# Patient Record
Sex: Female | Born: 2000 | Race: Black or African American | Hispanic: No | Marital: Single | Smoking: Never smoker
Health system: Southern US, Community
[De-identification: ages and names within clinical notes are randomized; demographics above are authoritative.]

## PROBLEM LIST (undated history)

## (undated) DIAGNOSIS — F419 Anxiety disorder, unspecified: Secondary | ICD-10-CM

## (undated) DIAGNOSIS — F4310 Post-traumatic stress disorder, unspecified: Secondary | ICD-10-CM

---

## 2020-10-16 ENCOUNTER — Emergency Department (HOSPITAL_COMMUNITY): Payer: BLUE CROSS/BLUE SHIELD

## 2020-10-16 ENCOUNTER — Emergency Department (HOSPITAL_COMMUNITY)
Admission: EM | Admit: 2020-10-16 | Discharge: 2020-10-17 | Disposition: A | Discharge status: Home / Self Care | Payer: BLUE CROSS/BLUE SHIELD | Attending: Emergency Medicine | Admitting: Emergency Medicine

## 2020-10-16 ENCOUNTER — Other Ambulatory Visit: Payer: Self-pay

## 2020-10-16 ENCOUNTER — Encounter (HOSPITAL_COMMUNITY): Payer: Self-pay | Admitting: Emergency Medicine

## 2020-10-16 DIAGNOSIS — M542 Cervicalgia: Secondary | ICD-10-CM | POA: Insufficient documentation

## 2020-10-16 DIAGNOSIS — R200 Anesthesia of skin: Secondary | ICD-10-CM | POA: Diagnosis not present

## 2020-10-16 DIAGNOSIS — R791 Abnormal coagulation profile: Secondary | ICD-10-CM | POA: Diagnosis not present

## 2020-10-16 DIAGNOSIS — R531 Weakness: Secondary | ICD-10-CM | POA: Diagnosis not present

## 2020-10-16 DIAGNOSIS — R202 Paresthesia of skin: Secondary | ICD-10-CM | POA: Diagnosis not present

## 2020-10-16 HISTORY — DX: Anxiety disorder, unspecified: F41.9

## 2020-10-16 HISTORY — DX: Post-traumatic stress disorder, unspecified: F43.10

## 2020-10-16 LAB — COMPREHENSIVE METABOLIC PANEL
ALT: 17 U/L (ref 0–44)
AST: 23 U/L (ref 15–41)
Albumin: 4.4 g/dL (ref 3.5–5.0)
Alkaline Phosphatase: 53 U/L (ref 38–126)
Anion gap: 8 (ref 5–15)
BUN: <5 mg/dL — ABNORMAL LOW (ref 6–20)
CO2: 24 mmol/L (ref 22–32)
Calcium: 9.8 mg/dL (ref 8.9–10.3)
Chloride: 104 mmol/L (ref 98–111)
Creatinine, Ser: 0.67 mg/dL (ref 0.44–1.00)
GFR, Estimated: >60 mL/min (ref >60)
Glucose, Bld: 83 mg/dL (ref 70–99)
Potassium: 3.5 mmol/L (ref 3.5–5.1)
Sodium: 136 mmol/L (ref 135–145)
Total Bilirubin: 0.8 mg/dL (ref 0.3–1.2)
Total Protein: 8.1 g/dL (ref 6.5–8.1)

## 2020-10-16 LAB — ETHANOL: Alcohol, Ethyl (B): <10 mg/dL (ref <10)

## 2020-10-16 LAB — URINALYSIS, ROUTINE W REFLEX MICROSCOPIC
Bilirubin Urine: NEGATIVE
Glucose, UA: NEGATIVE mg/dL
Ketones, ur: NEGATIVE mg/dL
Leukocytes,Ua: NEGATIVE
Nitrite: NEGATIVE
Protein, ur: NEGATIVE mg/dL
Specific Gravity, Urine: 1 — ABNORMAL LOW (ref 1.005–1.030)
pH: 7 (ref 5.0–8.0)

## 2020-10-16 LAB — CBC
HCT: 38.8 % (ref 36.0–46.0)
Hemoglobin: 13.2 g/dL (ref 12.0–15.0)
MCH: 26.8 pg (ref 26.0–34.0)
MCHC: 34 g/dL (ref 30.0–36.0)
MCV: 78.7 fL — ABNORMAL LOW (ref 80.0–100.0)
Platelets: 261 10*3/uL (ref 150–400)
RBC: 4.93 MIL/uL (ref 3.87–5.11)
RDW: 12.6 % (ref 11.5–15.5)
WBC: 8.8 10*3/uL (ref 4.0–10.5)
nRBC: 0 % (ref 0.0–0.2)

## 2020-10-16 LAB — RAPID URINE DRUG SCREEN, HOSP PERFORMED
Amphetamines: NOT DETECTED
Barbiturates: NOT DETECTED
Benzodiazepines: NOT DETECTED
Cocaine: NOT DETECTED
Opiates: NOT DETECTED
Tetrahydrocannabinol: NOT DETECTED

## 2020-10-16 LAB — DIFFERENTIAL
Abs Immature Granulocytes: 0.03 10*3/uL (ref 0.00–0.07)
Basophils Absolute: 0 10*3/uL (ref 0.0–0.1)
Basophils Relative: 1 %
Eosinophils Absolute: 0.2 10*3/uL (ref 0.0–0.5)
Eosinophils Relative: 2 %
Immature Granulocytes: 0 %
Lymphocytes Relative: 28 %
Lymphs Abs: 2.5 10*3/uL (ref 0.7–4.0)
Monocytes Absolute: 0.7 10*3/uL (ref 0.1–1.0)
Monocytes Relative: 8 %
Neutro Abs: 5.4 10*3/uL (ref 1.7–7.7)
Neutrophils Relative %: 61 %

## 2020-10-16 LAB — PROTIME-INR
INR: 1.1 (ref 0.8–1.2)
Prothrombin Time: 14.2 seconds (ref 11.4–15.2)

## 2020-10-16 LAB — APTT: aPTT: 28 seconds (ref 24–36)

## 2020-10-16 LAB — I-STAT BETA HCG BLOOD, ED (MC, WL, AP ONLY): I-stat hCG, quantitative: <5 m[IU]/mL (ref <5)

## 2020-10-16 NOTE — ED Provider Notes (Signed)
Emergency Medicine Provider Triage Evaluation Note  Emergency Medicine Provider Triage Evaluation Note  Kristine Maldonado , a 20 y.o. female  was evaluated in triage. Pt complains of left sided weakness, from [facility], transported by EMS.  Last felt normal at 3pm.  She was doing laundry when she began to feel weakness in her left arm.  She also felt some tingling in her left face.  No vision change or loss.  She was able to walk, talk normally.  No associated headache.  No facial droop or slurred speech.  No history of sickle cell or other chronic medical complaints.  She denies drugs or alcohol.  She feels that her symptoms have not changed.  No history of similar symptoms.  Denies other medical complaints.  No history of migraines.   Review of Systems  Positive: Weakness Negative: Headache  Physical Exam  BP 127/72   Pulse 74   Temp 98.9 F (37.2 C) (Oral)   LMP 10/14/2020 (Exact Date)   SpO2 100%  Gen:   Awake, no distress   Resp:  Normal effort  MSK:   Moves extremities without difficulty  Other:  No upper or lower extremity strength deficits on my exam.  No facial droop, slurred speech or gross cranial nerve abnormalities.  Negative pronator drift.  Medical Decision Making  Medically screening exam initiated at 8:08 PM.  Appropriate orders placed.  Cherity Facemire was informed that the remainder of the evaluation will be completed by another provider, this initial triage assessment does not replace that evaluation, and the importance of remaining in the ED until their evaluation is complete.  No code stroke as symptoms have been ongoing for more than 5 hours and patient is Zenaida Niece negative.   Renne Crigler, PA-C 10/16/20 2010    Milagros Loll, MD 10/16/20 2213

## 2020-10-16 NOTE — ED Triage Notes (Signed)
Patient here with left sided weakness since 3pm today.  Patient is from [facility].  No slurred speech, no facial droop, no abnormal gait.  Patient has stated she has had trouble forming words.  Patient does have a history of anxiety.  The weakness is mostly in left arm.

## 2020-10-16 NOTE — ED Notes (Signed)
Monesha Monreal (mother), req update on pt. [phone]

## 2020-10-17 ENCOUNTER — Emergency Department (HOSPITAL_COMMUNITY): Payer: BLUE CROSS/BLUE SHIELD

## 2020-10-17 IMAGING — MR MR CERVICAL SPINE WO/W CM
4 of 8 series · 17 of 48 positions shown · IV contrast (gadavist)
Comparison: Head CT [DATE].

CLINICAL DATA: Neuro deficit, acute, stroke suspected. Additional
history provided: Patient reports increased numbness to left arm,
numbness radiating from neck to arm.

EXAM:
MRI HEAD WITHOUT AND WITH CONTRAST
MRI CERVICAL SPINE WITHOUT AND WITH CONTRAST
TECHNIQUE: Multiplanar, multiecho pulse sequences of the brain and surrounding
structures, and cervical spine, to include the craniocervical
junction and cervicothoracic junction, were obtained without and
with intravenous contrast.
CONTRAST:  7 mL Gadavist intravenous contrast.

[Series 12: T2 · sagittal · 3.0mm · 0.43mm/px · 4 of 12 slices shown (1 of 2)]
[im 1/12]
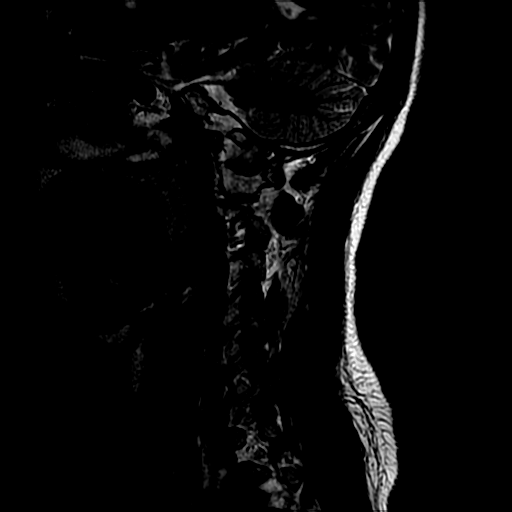
[im 4/12]
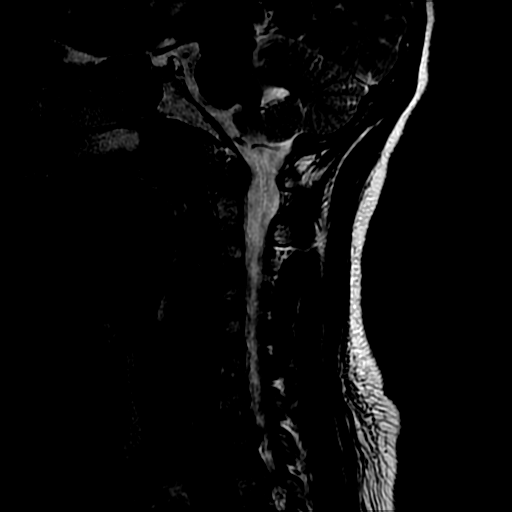
[im 8/12]
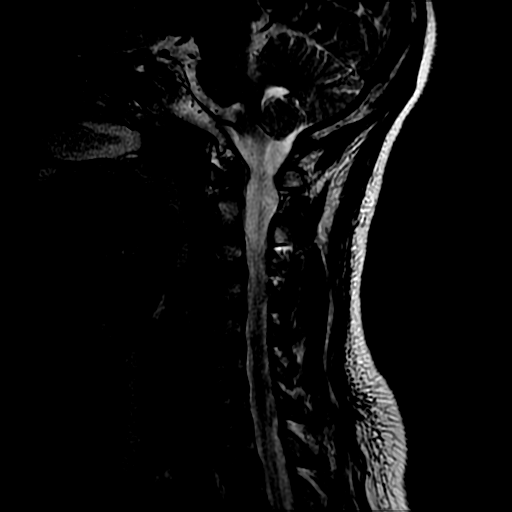
[im 12/12]
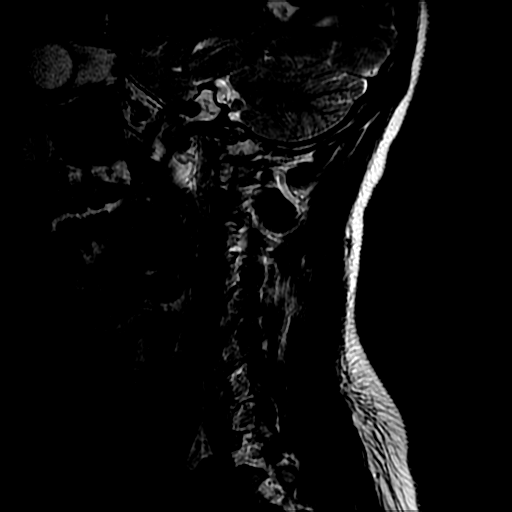

[Series 14: T1 · sagittal · 3.0mm · 0.43mm/px · 3 of 12 slices shown (1 of 2)]
[im 1/12]
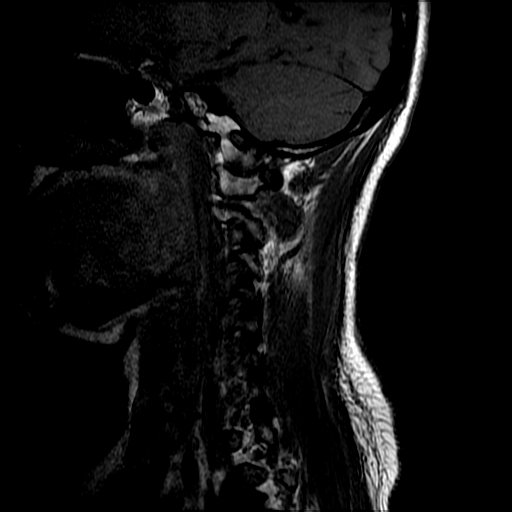
[im 8/12]
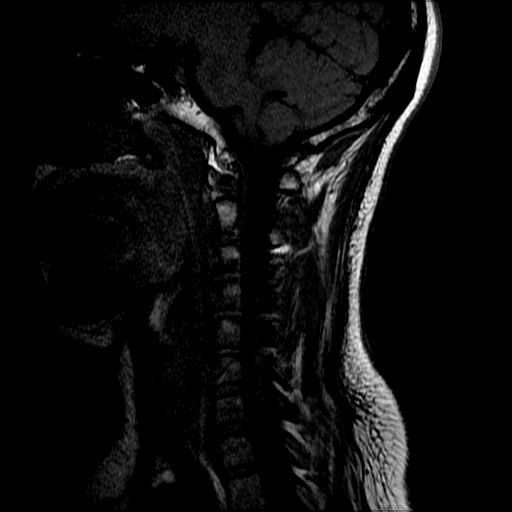
[im 12/12]
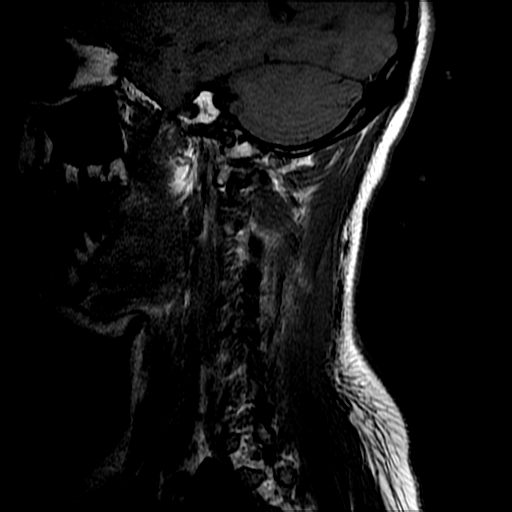

[Series 16: T2 · axial · 3.0mm · 0.39mm/px · z∈[-166,-91]mm · 7 of 28 slices shown (2 of 2)]
[im 1/28]
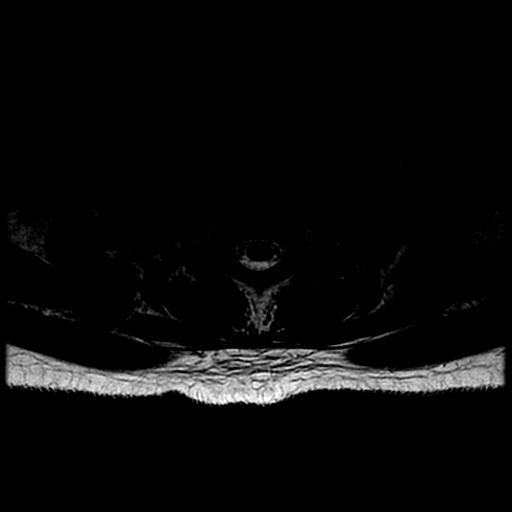
[im 4/28]
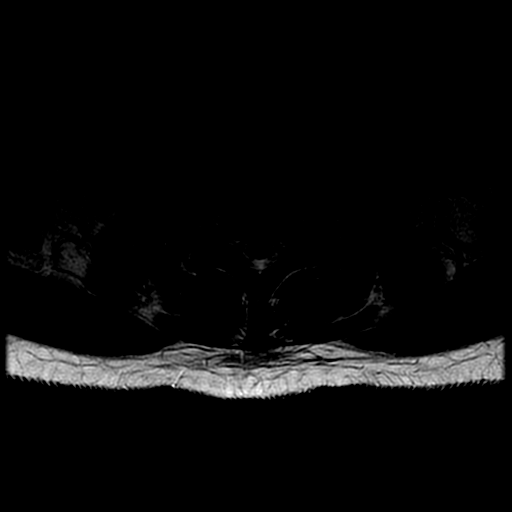
[im 8/28]
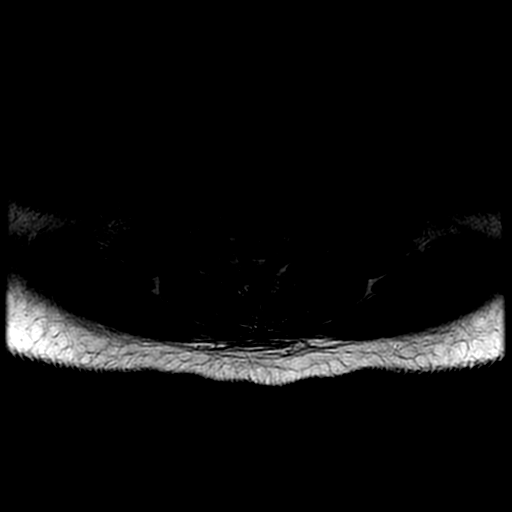
[im 12/28]
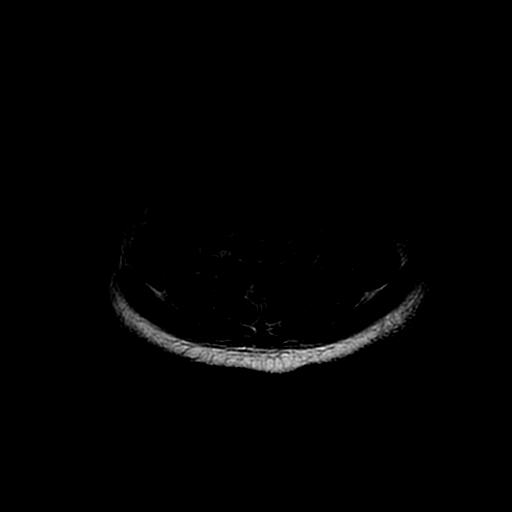
[im 16/28]
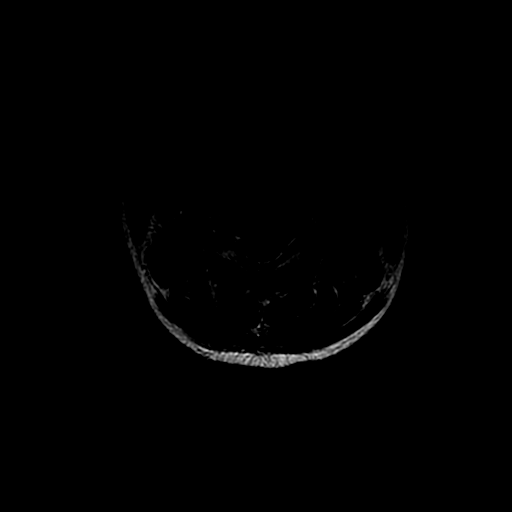
[im 20/28]
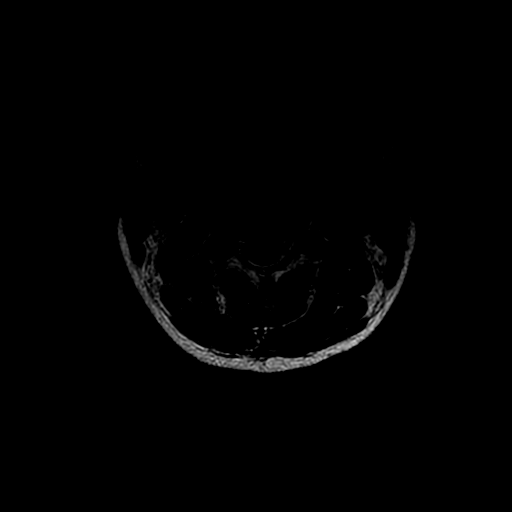
[im 24/28]
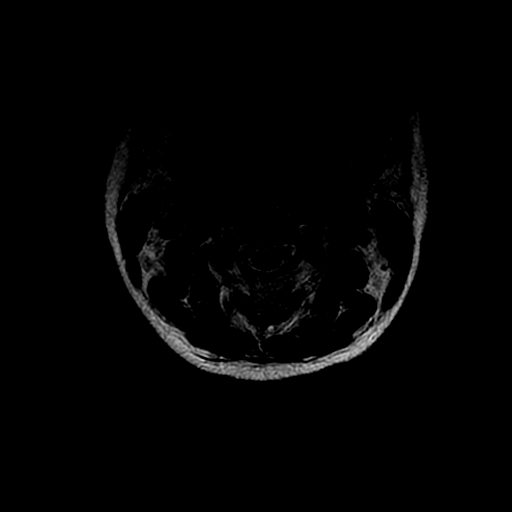

[Series 17: T1 · axial · non-contrast · 3.0mm · 0.39mm/px · z∈[-156,-91]mm · 3 of 28 slices shown (2 of 2)]
[im 4/28]
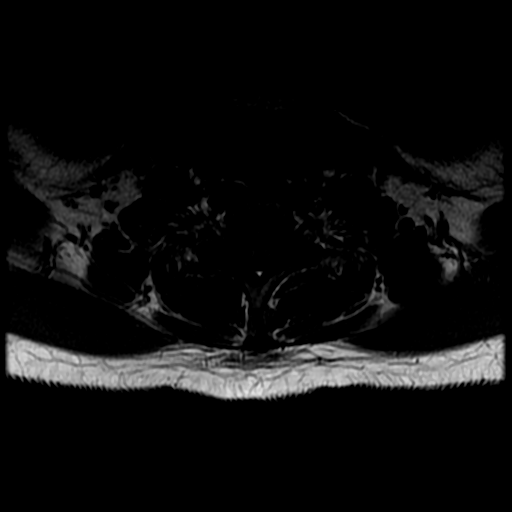
[im 16/28]
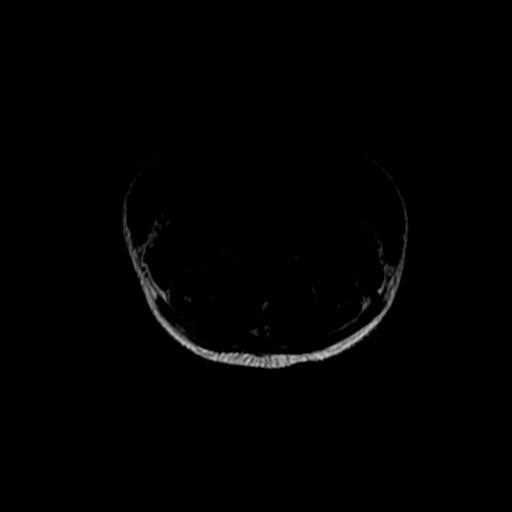
[im 24/28]
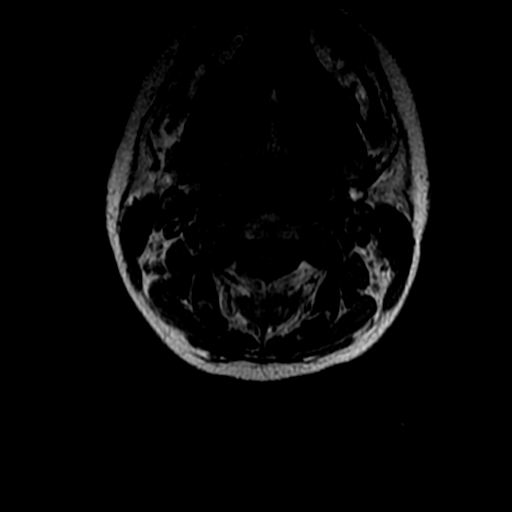

[17 of 48 positions shown; findings below may reference images not displayed]

FINDINGS: MRI HEAD FINDINGS

Brain:

Cerebral volume is normal.

No cortical encephalomalacia is identified. No significant cerebral
white matter disease.

There is no acute infarct.

No evidence of an intracranial mass.

No chronic intracranial blood products.

No extra-axial fluid collection.

No midline shift.

No pathologic intracranial enhancement identified.

Vascular: Maintained flow voids within the proximal large arterial
vessels.

Skull and upper cervical spine: No focal suspicious marrow lesion.

Sinuses/Orbits: Visualized orbits show no acute finding. No
significant paranasal sinus disease.

Other: Trace fluid within the right mastoid air cells.

MRI CERVICAL SPINE FINDINGS

Intermittently motion degraded exam, limiting evaluation. Most
notably, there is mild-to-moderate motion degradation of the axial
T2 TSE sequence and of the axial T1 weighted precontrast sequence.

Alignment: Straightening of the expected cervical lordosis. No
significant spondylolisthesis.

Vertebrae: Vertebral body height is maintained. No significant
marrow edema or focal suspicious osseous lesion.

Cord: Within the limitations of motion degradation, no spinal cord
signal abnormality is identified. No appreciable abnormal spinal
cord enhancement.

Posterior Fossa, vertebral arteries, paraspinal tissues: Posterior
fossa better assessed on concurrently performed brain MRI. Flow
voids preserved within the imaged cervical vertebral arteries.
Paraspinal soft tissues unremarkable.

Disc levels:

Intervertebral disc height and hydration are preserved.

Borderline congenitally narrow cervical spinal canal.

C2-C3: No significant disc herniation or stenosis.

C3-C4: Shallow disc bulge. Uncovertebral hypertrophy on the right.
No significant spinal canal stenosis. Mild right neural foraminal
narrowing.

C4-C5: Small central disc protrusion. Uncovertebral hypertrophy on
the right. The disc protrusion results in minimal focal effacement
of the ventral thecal sac (without spinal cord mass effect or
significant central canal stenosis). Minimal relative right neural
foraminal narrowing.

C5-C6: No significant disc herniation or stenosis.

C6-C7: No significant disc herniation or stenosis.

C7-T1: No significant disc herniation or stenosis.
IMPRESSION: MRI brain:

1. Unremarkable MRI appearance of the brain. No evidence of acute
intracranial abnormality.
2. Trace fluid within the right mastoid air cells.

MRI cervical spine:

1. Motion degraded exam.
2. Mild cervical spondylosis, as described and most notably as
follows.
3. At C4-C5, there is a small central disc protrusion. Uncovertebral
hypertrophy on the right. The disc protrusion results in minimal
focal effacement of the ventral thecal sac (without spinal cord mass
effect or significant central canal stenosis). Minimal relative
right neural foraminal narrowing.
4. At C3-C4, there is a shallow disc bulge without spinal canal
stenosis. Uncovertebral hypertrophy on the right resulting in
minimal relative right neural foraminal narrowing.
5. Within the limitations of motion degradation, no signal
abnormality or abnormal enhancement is identified within the
cervical spinal cord.
6. Straightening of the expected cervical lordosis.

## 2020-10-17 IMAGING — MR MR HEAD WO/W CM
8 of 16 series · 26 of 48 positions shown · IV contrast (gadavist)
Comparison: Head CT [DATE].

CLINICAL DATA: Neuro deficit, acute, stroke suspected. Additional
history provided: Patient reports increased numbness to left arm,
numbness radiating from neck to arm.

EXAM:
MRI HEAD WITHOUT AND WITH CONTRAST
MRI CERVICAL SPINE WITHOUT AND WITH CONTRAST
TECHNIQUE: Multiplanar, multiecho pulse sequences of the brain and surrounding
structures, and cervical spine, to include the craniocervical
junction and cervicothoracic junction, were obtained without and
with intravenous contrast.
CONTRAST:  7 mL Gadavist intravenous contrast.

[Series 3: DWI · axial · 3.0mm · 1.09mm/px · z∈[-64,+60]mm · 6 of 90 slices shown (1 of 4)]
[im 1/90]
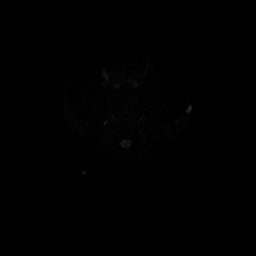
[im 18/90]
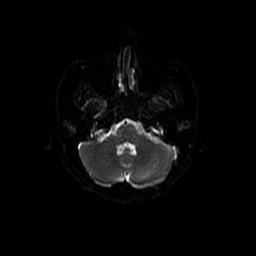
[im 36/90]
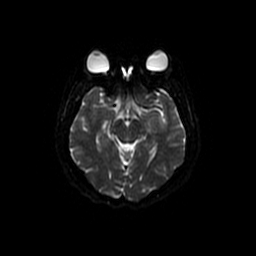
[im 54/90]
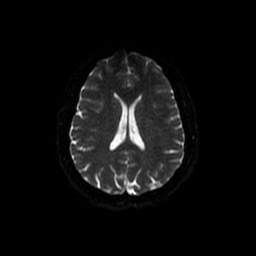
[im 72/90]
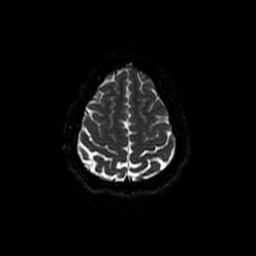
[im 90/90]
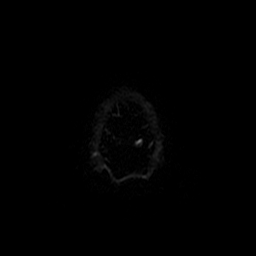

[Series 5: DWI · coronal · 5.0mm · 1.09mm/px · 3 of 66 slices shown (2 of 4)]
[im 1/66]
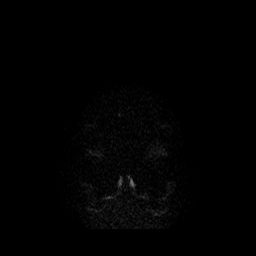
[im 33/66]
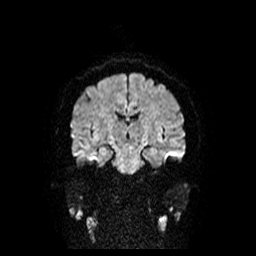
[im 66/66]
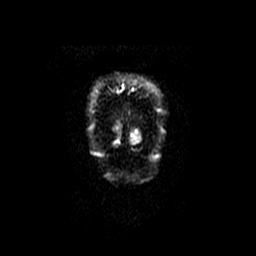

[Series 7: FLAIR · axial · 3.0mm · 0.43mm/px · 1 of 24 slices shown (1 of 2)]
[im 1/24]
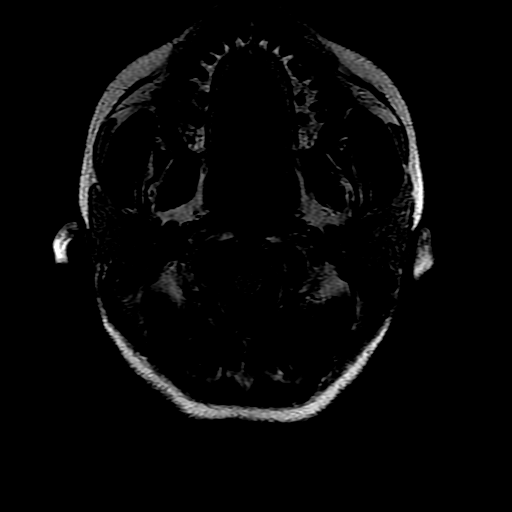

[Series 11: FLAIR · sagittal · 1.2mm · 0.49mm/px · 8 of 256 slices shown (2 of 2)]
[im 1/256]
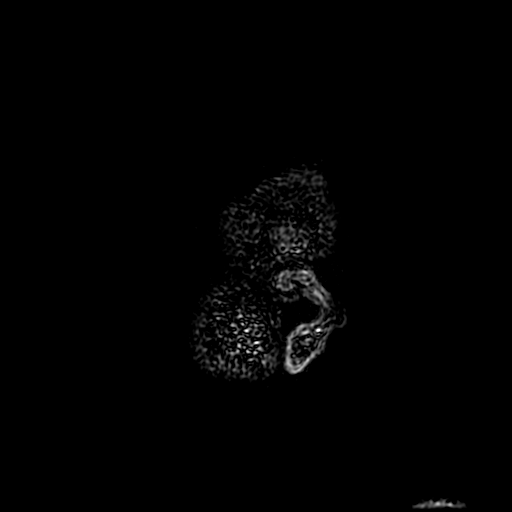
[im 40/256]
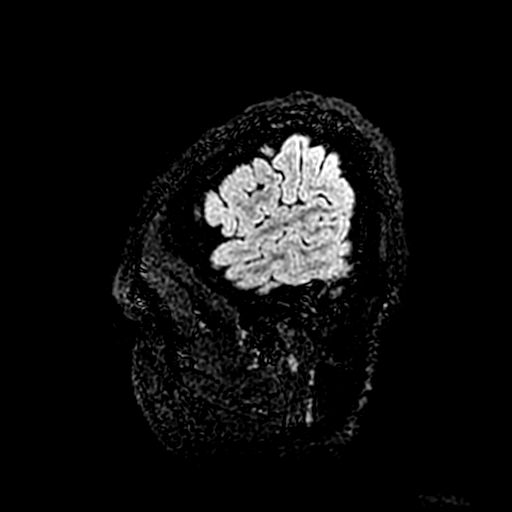
[im 79/256]
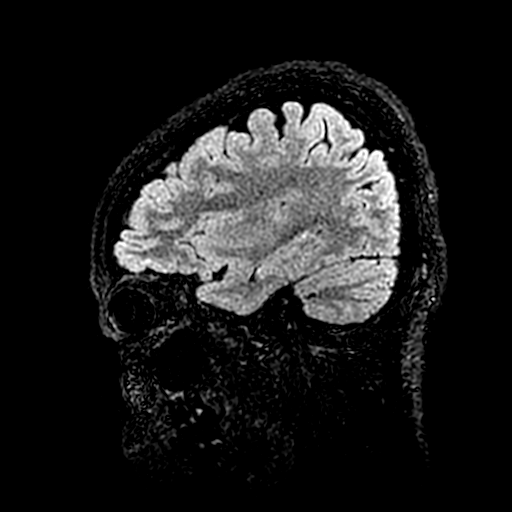
[im 118/256]
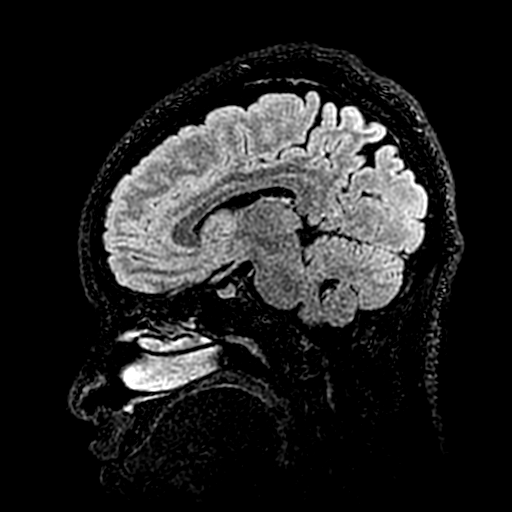
[im 138/256]
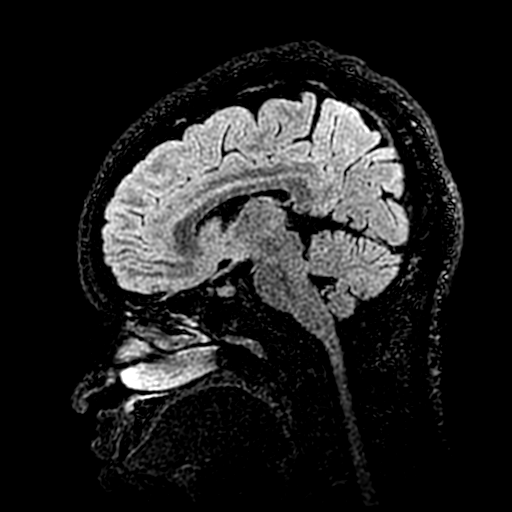
[im 177/256]
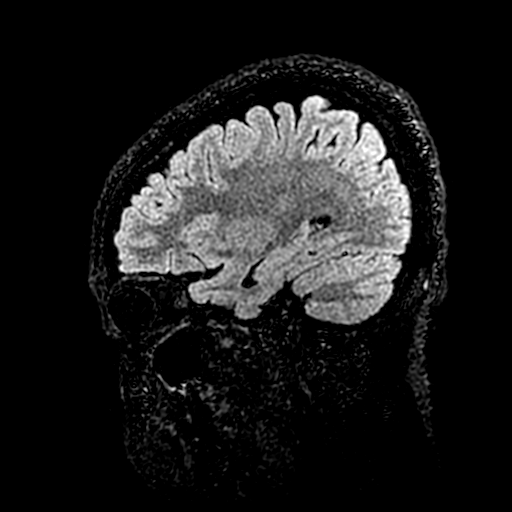
[im 216/256]
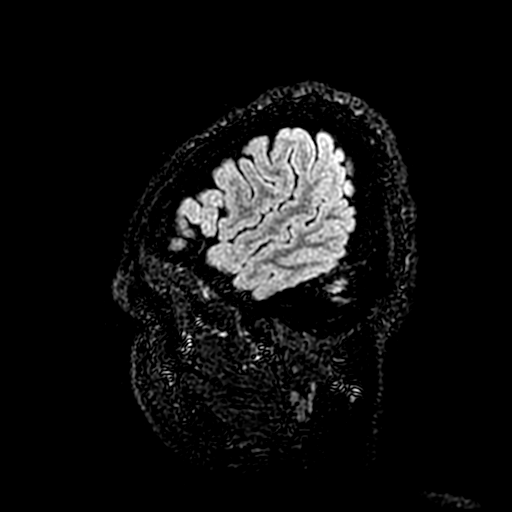
[im 256/256]
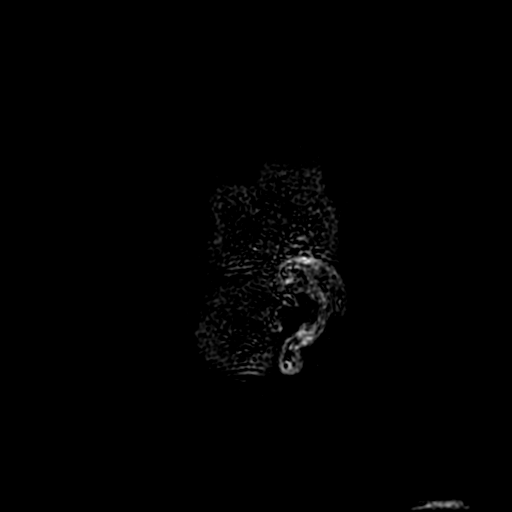

[Series 21: T1 post-contrast · axial · 3.0mm · 0.43mm/px · z∈[-69,+65]mm · 3 of 50 slices shown (1 of 2)]
[im 1/50]
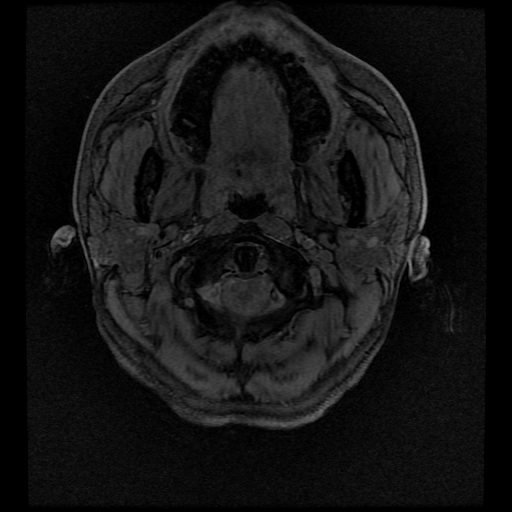
[im 25/50]
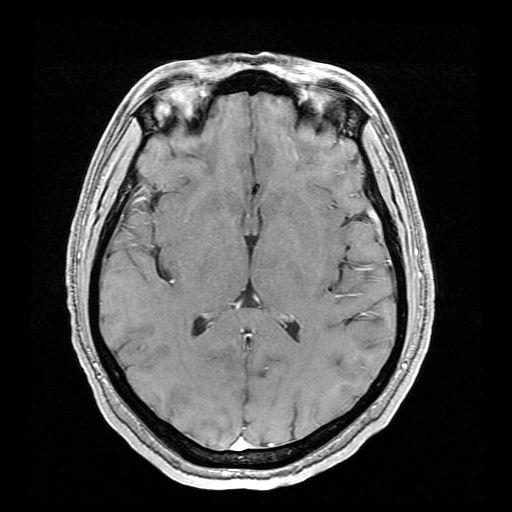
[im 50/50]
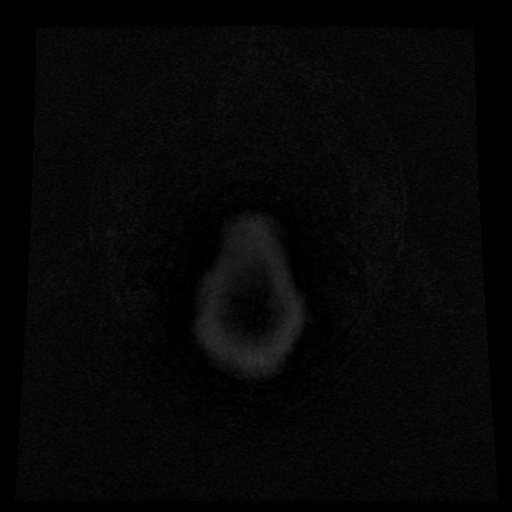

[Series 22: T1 post-contrast · coronal · 5.0mm · 0.39mm/px · 1 of 24 slices shown (2 of 2)]
[im 1/24]
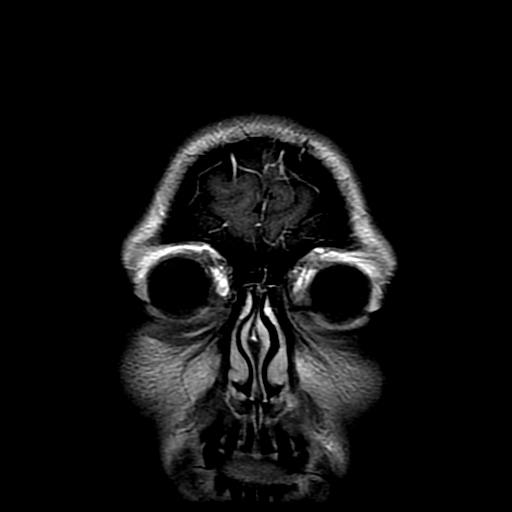

[Series 300: DWI · axial · 3.0mm · 1.09mm/px · z∈[-64,+60]mm · 2 of 45 slices shown (3 of 4)]
[im 1/45]
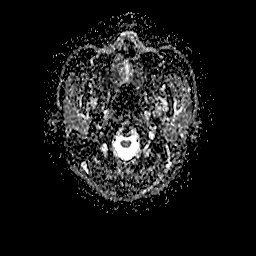
[im 45/45]
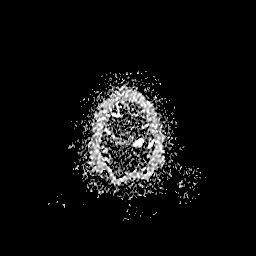

[Series 500: DWI · coronal · 5.0mm · 1.09mm/px · 2 of 33 slices shown (4 of 4)]
[im 1/33]
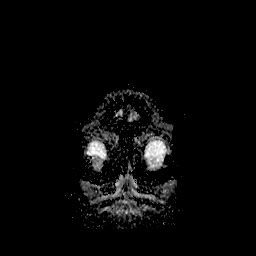
[im 33/33]
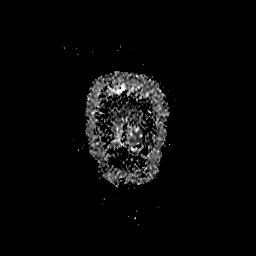

[26 of 48 positions shown; findings below may reference images not displayed]

FINDINGS: MRI HEAD FINDINGS

Brain:

Cerebral volume is normal.

No cortical encephalomalacia is identified. No significant cerebral
white matter disease.

There is no acute infarct.

No evidence of an intracranial mass.

No chronic intracranial blood products.

No extra-axial fluid collection.

No midline shift.

No pathologic intracranial enhancement identified.

Vascular: Maintained flow voids within the proximal large arterial
vessels.

Skull and upper cervical spine: No focal suspicious marrow lesion.

Sinuses/Orbits: Visualized orbits show no acute finding. No
significant paranasal sinus disease.

Other: Trace fluid within the right mastoid air cells.

MRI CERVICAL SPINE FINDINGS

Intermittently motion degraded exam, limiting evaluation. Most
notably, there is mild-to-moderate motion degradation of the axial
T2 TSE sequence and of the axial T1 weighted precontrast sequence.

Alignment: Straightening of the expected cervical lordosis. No
significant spondylolisthesis.

Vertebrae: Vertebral body height is maintained. No significant
marrow edema or focal suspicious osseous lesion.

Cord: Within the limitations of motion degradation, no spinal cord
signal abnormality is identified. No appreciable abnormal spinal
cord enhancement.

Posterior Fossa, vertebral arteries, paraspinal tissues: Posterior
fossa better assessed on concurrently performed brain MRI. Flow
voids preserved within the imaged cervical vertebral arteries.
Paraspinal soft tissues unremarkable.

Disc levels:

Intervertebral disc height and hydration are preserved.

Borderline congenitally narrow cervical spinal canal.

C2-C3: No significant disc herniation or stenosis.

C3-C4: Shallow disc bulge. Uncovertebral hypertrophy on the right.
No significant spinal canal stenosis. Mild right neural foraminal
narrowing.

C4-C5: Small central disc protrusion. Uncovertebral hypertrophy on
the right. The disc protrusion results in minimal focal effacement
of the ventral thecal sac (without spinal cord mass effect or
significant central canal stenosis). Minimal relative right neural
foraminal narrowing.

C5-C6: No significant disc herniation or stenosis.

C6-C7: No significant disc herniation or stenosis.

C7-T1: No significant disc herniation or stenosis.
IMPRESSION: MRI brain:

1. Unremarkable MRI appearance of the brain. No evidence of acute
intracranial abnormality.
2. Trace fluid within the right mastoid air cells.

MRI cervical spine:

1. Motion degraded exam.
2. Mild cervical spondylosis, as described and most notably as
follows.
3. At C4-C5, there is a small central disc protrusion. Uncovertebral
hypertrophy on the right. The disc protrusion results in minimal
focal effacement of the ventral thecal sac (without spinal cord mass
effect or significant central canal stenosis). Minimal relative
right neural foraminal narrowing.
4. At C3-C4, there is a shallow disc bulge without spinal canal
stenosis. Uncovertebral hypertrophy on the right resulting in
minimal relative right neural foraminal narrowing.
5. Within the limitations of motion degradation, no signal
abnormality or abnormal enhancement is identified within the
cervical spinal cord.
6. Straightening of the expected cervical lordosis.

## 2020-10-17 MED ORDER — METHYLPREDNISOLONE 4 MG PO TBPK: ORAL_TABLET | ORAL | 0 refills | Status: AC

## 2020-10-17 MED ORDER — GADOBUTROL 1 MMOL/ML IV SOLN
7.0000 mL | Freq: Once | INTRAVENOUS | Status: AC | PRN
  Administered 2020-10-17: 7 mL via INTRAVENOUS

## 2020-10-17 MED ORDER — ACETAMINOPHEN 500 MG PO TABS
500.0000 mg | ORAL_TABLET | Freq: Once | ORAL | Status: AC
  Administered 2020-10-17: 500 mg via ORAL
  Filled 2020-10-17: qty 1

## 2020-10-17 NOTE — Discharge Instructions (Addendum)
Seen in the ED for numbness and weakness of your left arm.  We found disc bulging of your C3-C5 discs.  You have some compression on one of the nerves into your left arm that is causing her symptoms.  You have been prescribed a steroid taper.  Please pick this up from the pharmacy and follow the instructions for dosing.  Need to follow-up with neurosurgery outpatient in 1-2 weeks.  If you do not hear from them within 1 to 2 days, please call the number provided in your discharge paperwork for Dr. Conchita Paris.

## 2020-10-17 NOTE — ED Notes (Signed)
Pt mother called twice wanting to know what's going on, pt told her mom she hasn't had her vital taken in a while so I said that I would inform the tech about the situation.

## 2020-10-17 NOTE — ED Provider Notes (Signed)
Was asked by staff to evaluate this patient for concern of left arm numbness.  She was medically screened last night, not within stroke window, van negative.  On my evaluation patient is well-appearing and in no acute distress.  Neurologic exam shows paresthesias of the left upper and lower extremity, no associated weakness, otherwise neurologic examination within normal limits.  Cranial nerves intact.  No pronator drift.  Negative Romberg's.  Patient CT head was negative, labs were reassuring.   Consult placed to neurology I spoke with Dr. Iver Nestle, advised MRI with and without contrast of the brain and cervical spine.  These orders were placed.  Patient was updated on care plan she is in agreement.  She denies the presence of the pacemaker or any metallic foreign bodies to prohibit MRI.  Patient is aware that she will need to await full evaluation in main emergency department once room is available   Medically screening exam initiated at 09:46AM.  Appropriate orders placed.  Kristine Maldonado was informed that the remainder of the evaluation will be completed by another provider, this initial triage assessment does not replace that evaluation, and the importance of remaining in the ED until their evaluation is complete.  Note: Portions of this report may have been transcribed using voice recognition software. Every effort was made to ensure accuracy; however, inadvertent computerized transcription errors may still be present.    Bill Salinas, PA-C 10/17/20 1610    Wynetta Fines, MD 10/18/20 [phone]

## 2020-10-17 NOTE — ED Notes (Signed)
Pt c/o increased numbness to L arm. Equal grip strength, no arm drift. Reports numbness radiating from neck down to arm. Denies injury

## 2020-10-17 NOTE — ED Provider Notes (Signed)
[facility] EMERGENCY DEPARTMENT Provider Note   CSN: [id number] Arrival date & time: 10/16/20  1953     History Chief Complaint  Patient presents with   Weakness    Kristine Maldonado is a 20 y.o. female.  Patient presents the emergency department with left-sided weakness and numbness.  This started yesterday afternoon at 4 PM.  She states that it started suddenly.  She denies any trauma to her neck.  The numbness and weakness have been unchanged since then.  Nothing is made it worse or better.  She said when this first happened she had associated word finding difficulty, however this is improved since then.  She denies any vision changes, chest pain, shortness of breath, weakness or numbness in her legs, slurred speech, facial droop, abdominal pain, nausea, vomiting.   Weakness Associated symptoms: no abdominal pain, no chest pain, no cough, no diarrhea, no dizziness, no dysuria, no fever, no headaches, no nausea, no shortness of breath and no vomiting       Past Medical History:  Diagnosis Date   Anxiety    PTSD (post-traumatic stress disorder)     There are no problems to display for this patient.   History reviewed. No pertinent surgical history.   OB History   No obstetric history on file.     No family history on file.  Social History   Tobacco Use   Smoking status: Never   Smokeless tobacco: Never  Substance Use Topics   Alcohol use: Not Currently   Drug use: Not Currently    Home Medications Prior to Admission medications   Medication Sig Start Date End Date Taking? Authorizing Provider  methylPREDNISolone (MEDROL DOSEPAK) 4 MG TBPK tablet Take 6 tablets (24 mg total) by mouth daily for 1 day, THEN 5 tablets (20 mg total) daily for 1 day, THEN 4 tablets (16 mg total) daily for 1 day, THEN 3 tablets (12 mg total) daily for 1 day, THEN 2 tablets (8 mg total) daily for 1 day, THEN 1 tablet (4 mg total) daily for 1 day. 10/17/20 10/23/20 Yes  Mikenzie Mccannon, Finis Bud, PA-C    Allergies    Patient has no known allergies.  Review of Systems   Review of Systems  Constitutional:  Negative for chills and fever.  HENT:  Negative for congestion, rhinorrhea and sore throat.   Eyes:  Negative for visual disturbance.  Respiratory:  Negative for cough, chest tightness and shortness of breath.   Cardiovascular:  Negative for chest pain, palpitations and leg swelling.  Gastrointestinal:  Negative for abdominal pain, blood in stool, constipation, diarrhea, nausea and vomiting.  Genitourinary:  Negative for dysuria, flank pain and hematuria.  Musculoskeletal:  Positive for neck pain. Negative for back pain.  Skin:  Negative for rash and wound.  Neurological:  Positive for weakness and numbness. Negative for dizziness, syncope, light-headedness and headaches.  Psychiatric/Behavioral:  Negative for confusion.   All other systems reviewed and are negative.  Physical Exam Updated Vital Signs BP 114/72   Pulse (!) 57   Temp 98.3 F (36.8 C)   Resp 15   LMP 10/14/2020 (Exact Date)   SpO2 100%   Physical Exam Vitals and nursing note reviewed.  Constitutional:      General: She is not in acute distress.    Appearance: Normal appearance. She is well-developed. She is not ill-appearing, toxic-appearing or diaphoretic.  HENT:     Head: Normocephalic and atraumatic.     Nose: No nasal  deformity.     Mouth/Throat:     Lips: Pink. No lesions.     Mouth: Mucous membranes are moist.     Tongue: Tongue does not deviate from midline.     Pharynx: Oropharynx is clear. Uvula midline. No oropharyngeal exudate or posterior oropharyngeal erythema.  Eyes:     General: Gaze aligned appropriately. No visual field deficit or scleral icterus.       Right eye: No discharge.        Left eye: No discharge.     Extraocular Movements: Extraocular movements intact.     Right eye: Normal extraocular motion and no nystagmus.     Left eye: Normal extraocular  motion and no nystagmus.     Conjunctiva/sclera: Conjunctivae normal.     Right eye: Right conjunctiva is not injected. No exudate or hemorrhage.    Left eye: Left conjunctiva is not injected. No exudate or hemorrhage.    Pupils: Pupils are equal, round, and reactive to light.  Neck:     Comments: There are some cervical midline tenderness to palpation along C3 and C4.  She denies any clavicle tenderness or AC joint tenderness. Cardiovascular:     Pulses: Normal pulses.          Radial pulses are 2+ on the right side and 2+ on the left side.  Pulmonary:     Effort: Pulmonary effort is normal. No respiratory distress.  Musculoskeletal:     Right shoulder: No tenderness or bony tenderness. Normal range of motion. Normal strength. Normal pulse.     Left shoulder: No tenderness or bony tenderness. Normal range of motion. Decreased strength. Normal pulse.     Cervical back: Normal range of motion. Tenderness present. Pain with movement and spinous process tenderness present.     Right lower leg: No edema.     Left lower leg: No edema.  Skin:    General: Skin is warm and dry.     Coloration: Skin is not jaundiced or pale.     Findings: No bruising, erythema, lesion or rash.  Neurological:     Mental Status: She is alert and oriented to person, place, and time.     GCS: GCS eye subscore is 4. GCS verbal subscore is 5. GCS motor subscore is 6.     Cranial Nerves: Cranial nerves are intact. No cranial nerve deficit, dysarthria or facial asymmetry.     Sensory: Sensory deficit present.     Motor: Weakness present.     Comments: Patient has decreased sensation along the lateral and medial side of the arm.  Sensation is more decreased on the medial nerve side of hand, however entire arm is affected.  Has 4 out of 5 strength in her shoulders, elbow, grip strength of the left side.  5 out of 5 strength on the right side.  Psychiatric:        Mood and Affect: Mood normal.        Speech: Speech  normal.        Behavior: Behavior normal. Behavior is cooperative.        Thought Content: Thought content normal.        Judgment: Judgment normal.    ED Results / Procedures / Treatments   Labs (all labs ordered are listed, but only abnormal results are displayed) Labs Reviewed  CBC - Abnormal; Notable for the following components:      Result Value   MCV 78.7 (*)    All other  components within normal limits  COMPREHENSIVE METABOLIC PANEL - Abnormal; Notable for the following components:   BUN <5 (*)    All other components within normal limits  URINALYSIS, ROUTINE W REFLEX MICROSCOPIC - Abnormal; Notable for the following components:   Color, Urine STRAW (*)    Specific Gravity, Urine 1.000 (*)    Hgb urine dipstick LARGE (*)    Bacteria, UA RARE (*)    All other components within normal limits  ETHANOL  PROTIME-INR  APTT  DIFFERENTIAL  RAPID URINE DRUG SCREEN, HOSP PERFORMED  I-STAT BETA HCG BLOOD, ED ([facility], [facility], [facility] ONLY)    EKG EKG Interpretation  Date/Time:  Monday October 16 2020 20:13:13 EDT Ventricular Rate:  67 PR Interval:  142 QRS Duration: 86 QT Interval:  374 QTC Calculation: 395 R Axis:   90 Text Interpretation: Normal sinus rhythm Rightward axis Borderline ECG Confirmed by Kristine Royal [phone]) on 10/17/2020 9:48:39 AM  Radiology CT HEAD WO CONTRAST  Result Date: 10/16/2020 CLINICAL DATA:  Left-sided weakness EXAM: CT HEAD WITHOUT CONTRAST TECHNIQUE: Contiguous axial images were obtained from the base of the skull through the vertex without intravenous contrast. COMPARISON:  None. FINDINGS: Brain: No evidence of acute infarction, hemorrhage, hydrocephalus, extra-axial collection or mass lesion/mass effect. Vascular: No hyperdense vessel or unexpected calcification. Skull: Normal. Negative for fracture or focal lesion. Sinuses/Orbits: No acute finding. Other: None. IMPRESSION: No acute intracranial abnormality noted. Electronically Signed   By: Alcide Clever  M.D.   On: 10/16/2020 21:03   MR Brain W and Wo Contrast  Result Date: 10/17/2020 CLINICAL DATA:  Neuro deficit, acute, stroke suspected. Additional history provided: Patient reports increased numbness to left arm, numbness radiating from neck to arm. EXAM: MRI HEAD WITHOUT AND WITH CONTRAST MRI CERVICAL SPINE WITHOUT AND WITH CONTRAST TECHNIQUE: Multiplanar, multiecho pulse sequences of the brain and surrounding structures, and cervical spine, to include the craniocervical junction and cervicothoracic junction, were obtained without and with intravenous contrast. CONTRAST:  7 mL Gadavist intravenous contrast. COMPARISON:  Head CT 10/16/2020. FINDINGS: MRI HEAD FINDINGS Brain: Cerebral volume is normal. No cortical encephalomalacia is identified. No significant cerebral white matter disease. There is no acute infarct. No evidence of an intracranial mass. No chronic intracranial blood products. No extra-axial fluid collection. No midline shift. No pathologic intracranial enhancement identified. Vascular: Maintained flow voids within the proximal large arterial vessels. Skull and upper cervical spine: No focal suspicious marrow lesion. Sinuses/Orbits: Visualized orbits show no acute finding. No significant paranasal sinus disease. Other: Trace fluid within the right mastoid air cells. MRI CERVICAL SPINE FINDINGS Intermittently motion degraded exam, limiting evaluation. Most notably, there is mild-to-moderate motion degradation of the axial T2 TSE sequence and of the axial T1 weighted precontrast sequence. Alignment: Straightening of the expected cervical lordosis. No significant spondylolisthesis. Vertebrae: Vertebral body height is maintained. No significant marrow edema or focal suspicious osseous lesion. Cord: Within the limitations of motion degradation, no spinal cord signal abnormality is identified. No appreciable abnormal spinal cord enhancement. Posterior Fossa, vertebral arteries, paraspinal tissues:  Posterior fossa better assessed on concurrently performed brain MRI. Flow voids preserved within the imaged cervical vertebral arteries. Paraspinal soft tissues unremarkable. Disc levels: Intervertebral disc height and hydration are preserved. Borderline congenitally narrow cervical spinal canal. C2-C3: No significant disc herniation or stenosis. C3-C4: Shallow disc bulge. Uncovertebral hypertrophy on the right. No significant spinal canal stenosis. Mild right neural foraminal narrowing. C4-C5: Small central disc protrusion. Uncovertebral hypertrophy on the right. The disc protrusion results in  minimal focal effacement of the ventral thecal sac (without spinal cord mass effect or significant central canal stenosis). Minimal relative right neural foraminal narrowing. C5-C6: No significant disc herniation or stenosis. C6-C7: No significant disc herniation or stenosis. C7-T1: No significant disc herniation or stenosis. IMPRESSION: MRI brain: 1. Unremarkable MRI appearance of the brain. No evidence of acute intracranial abnormality. 2. Trace fluid within the right mastoid air cells. MRI cervical spine: 1. Motion degraded exam. 2. Mild cervical spondylosis, as described and most notably as follows. 3. At C4-C5, there is a small central disc protrusion. Uncovertebral hypertrophy on the right. The disc protrusion results in minimal focal effacement of the ventral thecal sac (without spinal cord mass effect or significant central canal stenosis). Minimal relative right neural foraminal narrowing. 4. At C3-C4, there is a shallow disc bulge without spinal canal stenosis. Uncovertebral hypertrophy on the right resulting in minimal relative right neural foraminal narrowing. 5. Within the limitations of motion degradation, no signal abnormality or abnormal enhancement is identified within the cervical spinal cord. 6. Straightening of the expected cervical lordosis. Electronically Signed   By: Jackey Loge D.O.   On: 10/17/2020  12:46   MR Cervical Spine W or Wo Contrast  Result Date: 10/17/2020 CLINICAL DATA:  Neuro deficit, acute, stroke suspected. Additional history provided: Patient reports increased numbness to left arm, numbness radiating from neck to arm. EXAM: MRI HEAD WITHOUT AND WITH CONTRAST MRI CERVICAL SPINE WITHOUT AND WITH CONTRAST TECHNIQUE: Multiplanar, multiecho pulse sequences of the brain and surrounding structures, and cervical spine, to include the craniocervical junction and cervicothoracic junction, were obtained without and with intravenous contrast. CONTRAST:  7 mL Gadavist intravenous contrast. COMPARISON:  Head CT 10/16/2020. FINDINGS: MRI HEAD FINDINGS Brain: Cerebral volume is normal. No cortical encephalomalacia is identified. No significant cerebral white matter disease. There is no acute infarct. No evidence of an intracranial mass. No chronic intracranial blood products. No extra-axial fluid collection. No midline shift. No pathologic intracranial enhancement identified. Vascular: Maintained flow voids within the proximal large arterial vessels. Skull and upper cervical spine: No focal suspicious marrow lesion. Sinuses/Orbits: Visualized orbits show no acute finding. No significant paranasal sinus disease. Other: Trace fluid within the right mastoid air cells. MRI CERVICAL SPINE FINDINGS Intermittently motion degraded exam, limiting evaluation. Most notably, there is mild-to-moderate motion degradation of the axial T2 TSE sequence and of the axial T1 weighted precontrast sequence. Alignment: Straightening of the expected cervical lordosis. No significant spondylolisthesis. Vertebrae: Vertebral body height is maintained. No significant marrow edema or focal suspicious osseous lesion. Cord: Within the limitations of motion degradation, no spinal cord signal abnormality is identified. No appreciable abnormal spinal cord enhancement. Posterior Fossa, vertebral arteries, paraspinal tissues: Posterior fossa  better assessed on concurrently performed brain MRI. Flow voids preserved within the imaged cervical vertebral arteries. Paraspinal soft tissues unremarkable. Disc levels: Intervertebral disc height and hydration are preserved. Borderline congenitally narrow cervical spinal canal. C2-C3: No significant disc herniation or stenosis. C3-C4: Shallow disc bulge. Uncovertebral hypertrophy on the right. No significant spinal canal stenosis. Mild right neural foraminal narrowing. C4-C5: Small central disc protrusion. Uncovertebral hypertrophy on the right. The disc protrusion results in minimal focal effacement of the ventral thecal sac (without spinal cord mass effect or significant central canal stenosis). Minimal relative right neural foraminal narrowing. C5-C6: No significant disc herniation or stenosis. C6-C7: No significant disc herniation or stenosis. C7-T1: No significant disc herniation or stenosis. IMPRESSION: MRI brain: 1. Unremarkable MRI appearance of the brain. No evidence of  acute intracranial abnormality. 2. Trace fluid within the right mastoid air cells. MRI cervical spine: 1. Motion degraded exam. 2. Mild cervical spondylosis, as described and most notably as follows. 3. At C4-C5, there is a small central disc protrusion. Uncovertebral hypertrophy on the right. The disc protrusion results in minimal focal effacement of the ventral thecal sac (without spinal cord mass effect or significant central canal stenosis). Minimal relative right neural foraminal narrowing. 4. At C3-C4, there is a shallow disc bulge without spinal canal stenosis. Uncovertebral hypertrophy on the right resulting in minimal relative right neural foraminal narrowing. 5. Within the limitations of motion degradation, no signal abnormality or abnormal enhancement is identified within the cervical spinal cord. 6. Straightening of the expected cervical lordosis. Electronically Signed   By: Jackey Loge D.O.   On: 10/17/2020 12:46     Procedures Procedures   Medications Ordered in ED Medications  acetaminophen (TYLENOL) tablet 500 mg (500 mg Oral Given 10/17/20 0329)  gadobutrol (GADAVIST) 1 MMOL/ML injection 7 mL (7 mLs Intravenous Contrast Given 10/17/20 1224)    ED Course  I have reviewed the triage vital signs and the nursing notes.  Pertinent labs & imaging results that were available during my care of the patient were reviewed by me and considered in my medical decision making (see chart for details).  Clinical Course as of 10/17/20 1746  Tue Oct 17, 2020  1751 Dr. Iver Nestle [BM]  1650 I updated family regarding the plan of care.  We are waiting for neurosurgery consult call back for further recommendations. [GL]  1707 Repaged neurosurgery [GL]    Clinical Course User Index [BM] Bill Salinas, PA-C [GL] Victorino Dike Kathee Delton   MDM Rules/Calculators/A&P                         This is a healthy 20 year old female presents with paresthesias to her left arm. In triage, patient received pretty extensive work-up.  CBC, CMP, coag studies are unremarkable.  Ethanol level and UDS normal.  Urine unremarkable.  Pregnancy negative.  She originally received CT head without contrast with no acute abnormality.  Apparently, neurology was consulted and recommended MRI brain with and without and MR cervical spine with and without.  Reviewed MRI results.  MRI brain with no acute abnormality and no evidence of MS.  MR cervical spine with findings of C3-C4 shallow disc bulge with stenosis and minimal right neuroforaminal narrowing.  C4-C5 small central disc protrusion with minimal focal effacement of ventral thecal sac.  No spinal cord mass or significant stenosis.  Minimal right neural foraminal narrowing.  This read is consistent with a pinched nerve due to the disc bulging and protrusions.  At this point, I was able to assess patient.  She has continued paresthesias in her left arm.  Is more prominent on the medial nerve  side.  She has very subtle 4/5 weakness of her left upper extremity. I spoke with Dr. Rush Landmark regarding this patient.  He recommends consulting neurosurgery prior to discharge for follow-up and treatment recommendations.  1729: Dr. Rush Landmark spoke with Dr. Conchita Paris from Neurosurgery.  Recommend a Medrol dose pack at discharge.  They want to see her in the office in 1 to 2 weeks.  I placed ambulatory referral to neurosurgery.  Patient is a good candidate for discharge.  She is given final plan and diagnosis.  Told to return if she develops worsening symptoms.  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 Wants to see patient in Clinic in 1-2 weeks.   10/17/20 1731    methylPREDNISolone (MEDROL DOSEPAK) 4 MG TBPK tablet        10/17/20 1734             Claudie Leach, PA-C 10/17/20 1746    Tegeler, Canary Brim, MD 10/18/20 [phone]

## 2020-11-22 ENCOUNTER — Encounter (HOSPITAL_COMMUNITY): Payer: Self-pay

## 2020-11-22 ENCOUNTER — Ambulatory Visit (HOSPITAL_COMMUNITY)
Admission: EM | Admit: 2020-11-22 | Discharge: 2020-11-22 | Disposition: A | Discharge status: Home / Self Care | Payer: BLUE CROSS/BLUE SHIELD | Attending: Physician Assistant | Admitting: Physician Assistant

## 2020-11-22 ENCOUNTER — Other Ambulatory Visit: Payer: Self-pay

## 2020-11-22 DIAGNOSIS — R1011 Right upper quadrant pain: Secondary | ICD-10-CM | POA: Insufficient documentation

## 2020-11-22 LAB — COMPREHENSIVE METABOLIC PANEL
ALT: 15 U/L (ref 0–44)
AST: 35 U/L (ref 15–41)
Albumin: 4.3 g/dL (ref 3.5–5.0)
Alkaline Phosphatase: 50 U/L (ref 38–126)
Anion gap: 7 (ref 5–15)
BUN: 6 mg/dL (ref 6–20)
CO2: 25 mmol/L (ref 22–32)
Calcium: 9 mg/dL (ref 8.9–10.3)
Chloride: 104 mmol/L (ref 98–111)
Creatinine, Ser: 0.66 mg/dL (ref 0.44–1.00)
GFR, Estimated: >60 mL/min (ref >60)
Glucose, Bld: 82 mg/dL (ref 70–99)
Potassium: 3.5 mmol/L (ref 3.5–5.1)
Sodium: 136 mmol/L (ref 135–145)
Total Bilirubin: 0.8 mg/dL (ref 0.3–1.2)
Total Protein: 7.9 g/dL (ref 6.5–8.1)

## 2020-11-22 LAB — POCT URINALYSIS DIPSTICK, ED / UC
Bilirubin Urine: NEGATIVE
Glucose, UA: NEGATIVE mg/dL
Hgb urine dipstick: NEGATIVE
Ketones, ur: NEGATIVE mg/dL
Leukocytes,Ua: NEGATIVE
Nitrite: NEGATIVE
Protein, ur: NEGATIVE mg/dL
Specific Gravity, Urine: <=1.005 (ref 1.005–1.030)
Urobilinogen, UA: 0.2 mg/dL (ref 0.0–1.0)
pH: 6 (ref 5.0–8.0)

## 2020-11-22 LAB — CBC WITH DIFFERENTIAL/PLATELET
Abs Immature Granulocytes: 0.04 10*3/uL (ref 0.00–0.07)
Basophils Absolute: 0.1 10*3/uL (ref 0.0–0.1)
Basophils Relative: 1 %
Eosinophils Absolute: 0.2 10*3/uL (ref 0.0–0.5)
Eosinophils Relative: 2 %
HCT: 39.3 % (ref 36.0–46.0)
Hemoglobin: 13.6 g/dL (ref 12.0–15.0)
Immature Granulocytes: 1 %
Lymphocytes Relative: 31 %
Lymphs Abs: 2.8 10*3/uL (ref 0.7–4.0)
MCH: 27 pg (ref 26.0–34.0)
MCHC: 34.6 g/dL (ref 30.0–36.0)
MCV: 78.1 fL — ABNORMAL LOW (ref 80.0–100.0)
Monocytes Absolute: 0.6 10*3/uL (ref 0.1–1.0)
Monocytes Relative: 7 %
Neutro Abs: 5.2 10*3/uL (ref 1.7–7.7)
Neutrophils Relative %: 58 %
Platelets: 276 10*3/uL (ref 150–400)
RBC: 5.03 MIL/uL (ref 3.87–5.11)
RDW: 12.8 % (ref 11.5–15.5)
WBC: 8.9 10*3/uL (ref 4.0–10.5)
nRBC: 0 % (ref 0.0–0.2)

## 2020-11-22 LAB — POC URINE PREG, ED: Preg Test, Ur: NEGATIVE

## 2020-11-22 NOTE — Discharge Instructions (Signed)
Avoid fatty foods.  Follow-up with gastroenterology as we discussed.  We will contact you if your lab work is abnormal; if you have abnormal blood work you might need to go to the hospital.  As we discussed, if you have any worsening symptoms including severe pain, fever, lightheadedness, feeling like you cannot pass out, yellowing of your eyes you need to go to the hospital immediately.

## 2020-11-22 NOTE — ED Triage Notes (Signed)
Pt presents with right upper quadrant abdominal pain x 3 weeks. Pt had temperature of 99.8. pt denies any other symptoms.

## 2020-11-22 NOTE — ED Provider Notes (Signed)
[facility]    CSN: [id number] Arrival date & time: 11/22/20  1902      History   Chief Complaint Chief Complaint  Patient presents with   Abdominal Pain    HPI Kristine Maldonado is a 20 y.o. female.   Patient presents today with a 3-week history of intermittent right upper abdominal pain.  She denies any known change in diet or triggers.  During episodes pain is rated 3/4 on a 0-10 pain scale, localized to upper abdomen without radiation, described as cramping/sharp, no aggravating relieving factors identified.  She denies previous abdominal surgeries and still has gallbladder and appendix.  She has started running a fever with highest recorded 99.8 but denies additional symptoms including skin color change, lightheadedness, low blood pressure, syncopal episodes, nausea, vomiting.  She has not tried any medication for symptom management.   Past Medical History:  Diagnosis Date   Anxiety    PTSD (post-traumatic stress disorder)     There are no problems to display for this patient.   History reviewed. No pertinent surgical history.  OB History   No obstetric history on file.      Home Medications    Prior to Admission medications   Not on File    Family History Family History  Problem Relation Age of Onset   Diabetes Mother    Diabetes Father     Social History Social History   Tobacco Use   Smoking status: Never   Smokeless tobacco: Never  Substance Use Topics   Alcohol use: Not Currently   Drug use: Not Currently     Allergies   Patient has no known allergies.   Review of Systems Review of Systems  Constitutional:  Positive for activity change. Negative for appetite change, fatigue and fever.  Respiratory:  Negative for cough and shortness of breath.   Cardiovascular:  Negative for chest pain.  Gastrointestinal:  Positive for abdominal pain. Negative for diarrhea, nausea and vomiting.  Genitourinary:  Negative for dysuria, frequency  and urgency.  Musculoskeletal:  Negative for arthralgias and myalgias.  Neurological:  Negative for dizziness, light-headedness and headaches.    Physical Exam Triage Vital Signs ED Triage Vitals  Enc Vitals Group     BP 11/22/20 1956 119/70     Pulse Rate 11/22/20 1956 69     Resp 11/22/20 1956 19     Temp 11/22/20 1956 99.3 F (37.4 C)     Temp src --      SpO2 11/22/20 1956 98 %     Weight --      Height --      Head Circumference --      Peak Flow --      Pain Score 11/22/20 1955 4     Pain Loc --      Pain Edu? --      Excl. in GC? --    No data found.  Updated Vital Signs BP 119/70   Pulse 69   Temp 99.3 F (37.4 C)   Resp 19   LMP 10/21/2020   SpO2 98%   Visual Acuity Right Eye Distance:   Left Eye Distance:   Bilateral Distance:    Right Eye Near:   Left Eye Near:    Bilateral Near:     Physical Exam Vitals reviewed.  Constitutional:      General: She is awake. She is not in acute distress.    Appearance: Normal appearance. She is well-developed. She is  not ill-appearing.     Comments: Very pleasant female appears stated age in no acute distress sitting comfortably on exam room table  HENT:     Head: Normocephalic and atraumatic.  Cardiovascular:     Rate and Rhythm: Normal rate and regular rhythm.     Heart sounds: Normal heart sounds, S1 normal and S2 normal. No murmur heard. Pulmonary:     Effort: Pulmonary effort is normal.     Breath sounds: Normal breath sounds. No wheezing, rhonchi or rales.     Comments: Clear to auscultation bilaterally Abdominal:     General: Bowel sounds are normal.     Palpations: Abdomen is soft.     Tenderness: There is no abdominal tenderness. There is no right CVA tenderness, left CVA tenderness, guarding or rebound. Negative signs include Murphy's sign.     Comments: Benign abdominal exam.  No significant tenderness palpation.  Negative Murphy sign.  Psychiatric:        Behavior: Behavior is cooperative.      UC Treatments / Results  Labs (all labs ordered are listed, but only abnormal results are displayed) Labs Reviewed  CBC WITH DIFFERENTIAL/PLATELET  COMPREHENSIVE METABOLIC PANEL  POCT URINALYSIS DIPSTICK, ED / UC  POC URINE PREG, ED    EKG   Radiology No results found.  Procedures Procedures (including critical care time)  Medications Ordered in UC Medications - No data to display  Initial Impression / Assessment and Plan / UC Course  I have reviewed the triage vital signs and the nursing notes.  Pertinent labs & imaging results that were available during my care of the patient were reviewed by me and considered in my medical decision making (see chart for details).     Vital signs and physical exam reassuring today; no indication for emergent evaluation or imaging.  Concern for cholelithiasis given clinical presentation.  Low suspicion for cholecystitis given negative Murphy sign and patient is afebrile in clinic today.  CBC and CMP were obtained today and discussed that if she has significant leukocytosis or abnormal liver function test she will need to go to the ER.  She was given contact information for GI and encouraged to call to schedule an appointment as soon as possible.  Discussed that we do not have imaging capabilities in urgent care and if she has persistent/worsening symptoms she needs to go to the ER.  Discussed alarm symptoms that warrant going to the ER including severe pain, fever, lightheadedness, low blood pressure, yellowing of skin/eyes, nausea, vomiting.  Strict return precautions given to which she expressed understanding.  Final Clinical Impressions(s) / UC Diagnoses   Final diagnoses:  RUQ abdominal pain     Discharge Instructions      Avoid fatty foods.  Follow-up with gastroenterology as we discussed.  We will contact you if your lab work is abnormal; if you have abnormal blood work you might need to go to the hospital.  As we discussed, if  you have any worsening symptoms including severe pain, fever, lightheadedness, feeling like you cannot pass out, yellowing of your eyes you need to go to the hospital immediately.     ED Prescriptions   None    PDMP not reviewed this encounter.   Jeani Hawking, PA-C 11/22/20 2049

## 2020-11-27 ENCOUNTER — Ambulatory Visit: Payer: BLUE CROSS/BLUE SHIELD | Admitting: Gastroenterology
